# Patient Record
Sex: Male | Born: 1990 | Hispanic: Yes | Marital: Married | Smoking: Never smoker
Health system: Southern US, Community
[De-identification: ages and names within clinical notes are randomized; demographics above are authoritative.]

---

## 2019-11-25 ENCOUNTER — Emergency Department (HOSPITAL_COMMUNITY)
Admission: EM | Admit: 2019-11-25 | Discharge: 2019-11-25 | Disposition: A | Discharge status: Home / Self Care | Payer: Self-pay | Attending: Emergency Medicine | Admitting: Emergency Medicine

## 2019-11-25 ENCOUNTER — Emergency Department (HOSPITAL_COMMUNITY): Payer: Self-pay

## 2019-11-25 ENCOUNTER — Other Ambulatory Visit: Payer: Self-pay

## 2019-11-25 ENCOUNTER — Encounter (HOSPITAL_COMMUNITY): Payer: Self-pay | Admitting: Emergency Medicine

## 2019-11-25 DIAGNOSIS — Y92007 Garden or yard of unspecified non-institutional (private) residence as the place of occurrence of the external cause: Secondary | ICD-10-CM | POA: Insufficient documentation

## 2019-11-25 DIAGNOSIS — S90852A Superficial foreign body, left foot, initial encounter: Secondary | ICD-10-CM

## 2019-11-25 DIAGNOSIS — Z23 Encounter for immunization: Secondary | ICD-10-CM | POA: Insufficient documentation

## 2019-11-25 DIAGNOSIS — S91322A Laceration with foreign body, left foot, initial encounter: Secondary | ICD-10-CM | POA: Insufficient documentation

## 2019-11-25 DIAGNOSIS — Y999 Unspecified external cause status: Secondary | ICD-10-CM | POA: Insufficient documentation

## 2019-11-25 DIAGNOSIS — W294XXA Contact with nail gun, initial encounter: Secondary | ICD-10-CM | POA: Insufficient documentation

## 2019-11-25 DIAGNOSIS — Y93H9 Activity, other involving exterior property and land maintenance, building and construction: Secondary | ICD-10-CM | POA: Insufficient documentation

## 2019-11-25 MED ORDER — TETANUS-DIPHTH-ACELL PERTUSSIS 5-2.5-18.5 LF-MCG/0.5 IM SUSP
0.5000 mL | Freq: Once | INTRAMUSCULAR | Status: AC
  Administered 2019-11-25: 0.5 mL via INTRAMUSCULAR
  Filled 2019-11-25: qty 0.5

## 2019-11-25 MED ORDER — DOXYCYCLINE HYCLATE 100 MG PO CAPS: 100.0000 mg | ORAL_CAPSULE | Freq: Two times a day (BID) | ORAL | 0 refills | Status: AC

## 2019-11-25 MED ORDER — FENTANYL CITRATE (PF) 100 MCG/2ML IJ SOLN
50.0000 ug | Freq: Once | INTRAMUSCULAR | Status: AC
  Administered 2019-11-25: 50 ug via INTRAVENOUS
  Filled 2019-11-25: qty 2

## 2019-11-25 MED ORDER — CEFAZOLIN SODIUM-DEXTROSE 2-4 GM/100ML-% IV SOLN
2.0000 g | Freq: Once | INTRAVENOUS | Status: AC
  Administered 2019-11-25: 2 g via INTRAVENOUS
  Filled 2019-11-25: qty 100

## 2019-11-25 MED ORDER — LIDOCAINE HCL (PF) 1 % IJ SOLN
5.0000 mL | Freq: Once | INTRAMUSCULAR | Status: AC
  Administered 2019-11-25: 5 mL
  Filled 2019-11-25: qty 5

## 2019-11-25 NOTE — ED Provider Notes (Signed)
[facility] EMERGENCY DEPARTMENT Provider Note   CSN: [id number] Arrival date & time: 11/25/19  1241     History No chief complaint on file.  Spanish video interpreter used throughout visit Frank Banks is a 29 y.o. male otherwise healthy no daily medication use presents today after he accidentally shot himself in the foot with a nail gun.  He was building a Scientist, physiological in his backyard when the injury occurred.  He reports that the nail gun accidentally went off and the now shot through the top of his shoe straight into his foot.  He reports immediate onset sharp pain constant worse with movement of the area improved with rest, no medication prior to arrival, pain is moderate in severity.  He denies any other injuries or concerns today.  Last tetanus shot when he was 29 years old.  Denies fever/chills, numbness/tingling, weakness, arthralgias, myalgias or any additional concerns. HPI     History reviewed. No pertinent past medical history.  There are no problems to display for this patient.   History reviewed. No pertinent surgical history.     No family history on file.  Social History   Tobacco Use  . Smoking status: Not on file  Substance Use Topics  . Alcohol use: Not on file  . Drug use: Not on file    Home Medications Prior to Admission medications   Medication Sig Start Date End Date Taking? Authorizing Provider  doxycycline (VIBRAMYCIN) 100 MG capsule Take 1 capsule (100 mg total) by mouth 2 (two) times daily for 7 days. 11/25/19 12/02/19  Bill Salinas, PA-C    Allergies    Patient has no known allergies.  Review of Systems   Review of Systems Ten systems are reviewed and are negative for acute change except as noted in the HPI  Physical Exam Updated Vital Signs BP 120/65   Pulse 64   Temp 98.8 F (37.1 C) (Oral)   Resp 16   SpO2 98%   Physical Exam Constitutional:      General: He is not in acute distress.    Appearance: Normal  appearance. He is well-developed. He is not ill-appearing or diaphoretic.  HENT:     Head: Normocephalic and atraumatic.     Right Ear: External ear normal.     Left Ear: External ear normal.     Nose: Nose normal.  Eyes:     General: Vision grossly intact. Gaze aligned appropriately.     Pupils: Pupils are equal, round, and reactive to light.  Neck:     Trachea: Trachea and phonation normal. No tracheal deviation.  Cardiovascular:     Pulses:          Dorsalis pedis pulses are 2+ on the right side and 2+ on the left side.  Pulmonary:     Effort: Pulmonary effort is normal. No respiratory distress.  Abdominal:     General: There is no distension.     Palpations: Abdomen is soft.     Tenderness: There is no abdominal tenderness. There is no guarding or rebound.  Musculoskeletal:        General: Normal range of motion.     Cervical back: Normal range of motion.     Right knee: Normal.     Left knee: Normal.     Right lower leg: Normal.     Left lower leg: Normal.     Right ankle: Normal.     Right Achilles Tendon: Normal.  Left ankle: Normal.     Left Achilles Tendon: Normal.     Right foot: Normal.     Left foot: Laceration (Foreign body, nail) present.       Feet:  Feet:     Right foot:     Protective Sensation: 5 sites tested. 5 sites sensed.     Left foot:     Protective Sensation: 5 sites tested. 5 sites sensed.     Comments: Foreign body enter his chest proximal to the dorsal fourth toe. Skin:    General: Skin is warm and dry.  Neurological:     Mental Status: He is alert.     GCS: GCS eye subscore is 4. GCS verbal subscore is 5. GCS motor subscore is 6.     Comments: Speech is clear and goal oriented, follows commands Major Cranial nerves without deficit, no facial droop Moves extremities without ataxia, coordination intact  Psychiatric:        Behavior: Behavior normal.     ED Results / Procedures / Treatments   Labs (all labs ordered are listed, but  only abnormal results are displayed) Labs Reviewed - No data to display  EKG None  Radiology DG Foot 2 Views Left  Result Date: 11/25/2019 CLINICAL DATA:  Foot impaled by nail gun EXAM: LEFT FOOT - 2 VIEW COMPARISON:  None. FINDINGS: Frontal and lateral views obtained. Foot is immobilized by a boot like device. There is a nail which extends into the foot at the level of the third metatarsal. The nail appears to pass immediately medial to the midportion of the third metatarsal. The tip of the nail is slightly posterior and volar to the midportion of the third metatarsal. No fracture or dislocation is evident. Joint spaces appear normal. No erosive change. IMPRESSION: Nail noted at the level of the mid third metatarsal. Nail appears to pass immediately medial to the midportion of the third metatarsal without fracture or dislocation evident. It is difficult to exclude nail abutting the medial aspect of the mid third metatarsal. Other bony structures appear normal.  No arthropathy evident. Electronically Signed   By: Lowella Grip III M.D.   On: 11/25/2019 13:29   DG Foot Complete Left  Result Date: 11/25/2019 CLINICAL DATA:  Foreign body removal EXAM: LEFT FOOT - COMPLETE 3+ VIEW COMPARISON:  Same-day radiograph FINDINGS: Interval removal of metallic nail from the distal forefoot. No acute fracture. No dislocation. No residual metallic radiopaque foreign body. Osseous structures are intact. No significant arthropathy. IMPRESSION: No acute fracture or residual radiopaque foreign body. Electronically Signed   By: Davina Poke D.O.   On: 11/25/2019 16:30    Procedures .Foreign Body Removal  Date/Time: 11/25/2019 5:42 PM Performed by: Deliah Boston, PA-C Authorized by: Deliah Boston, PA-C  Consent: Verbal consent obtained. Risks and benefits: risks, benefits and alternatives were discussed Consent given by: patient Patient understanding: patient states understanding of the  procedure being performed Patient consent: the patient's understanding of the procedure matches consent given Relevant documents: relevant documents present and verified Test results: test results available and properly labeled Imaging studies: imaging studies available Required items: required blood products, implants, devices, and special equipment available Patient identity confirmed: verbally with patient and arm band Time out: Immediately prior to procedure a "time out" was called to verify the correct patient, procedure, equipment, support staff and site/side marked as required. Body area: skin General location: lower extremity Location details: left foot Anesthesia: local infiltration  Anesthesia: Local Anesthetic:  lidocaine 1% without epinephrine  Sedation: Patient sedated: no  Patient restrained: no Patient cooperative: yes Localization method: visualized Removal mechanism: Manually. Dressing: antibiotic ointment and dressing applied Depth: deep Complexity: simple 1 objects recovered. Objects recovered: Metal nail Post-procedure assessment: foreign body removed Patient tolerance: patient tolerated the procedure well with no immediate complications   (including critical care time)  Medications Ordered in ED Medications  fentaNYL (SUBLIMAZE) injection 50 mcg (50 mcg Intravenous Given 11/25/19 1426)  Tdap (BOOSTRIX) injection 0.5 mL (0.5 mLs Intramuscular Given 11/25/19 1454)  lidocaine (PF) (XYLOCAINE) 1 % injection 5 mL (5 mLs Infiltration Given by Other 11/25/19 1638)  fentaNYL (SUBLIMAZE) injection 50 mcg (50 mcg Intravenous Given 11/25/19 1531)  ceFAZolin (ANCEF) IVPB 2g/100 mL premix (0 g Intravenous Stopped 11/25/19 1742)    ED Course  I have reviewed the triage vital signs and the nursing notes.  Pertinent labs & imaging results that were available during my care of the patient were reviewed by me and considered in my medical decision making (see chart for  details).  Clinical Course as of Nov 24 1804  Tue Nov 25, 2019  1509 Local anesthesia, remove, xray   [BM]  1717 Discharge with orthopedic follow-up, doxycycline for prophylaxis   [BM]    Clinical Course User Index [BM] Elizabeth Palau   MDM Rules/Calculators/A&P                      DG Left Foot: IMPRESSION: Nail noted at the level of the mid third metatarsal. Nail appears to pass immediately medial to the midportion of the third metatarsal without fracture or dislocation evident. It is difficult to exclude nail abutting the medial aspect of the mid third metatarsal. Other bony structures appear normal.  No arthropathy evident. - 29 year old otherwise healthy male presents today after shooting himself in the top of his left foot with a nail gun.  No other injuries or concerns today.  Tdap was updated today.  Pain medication given and shoe was removed with trauma shears.  I personally reviewed x-ray and agree with radiologist interpretation.  I consulted with orthopedic specialist physician assistant Earney Hamburg who advised local anesthesia, remove foreign body and repeat x-ray.  This was performed successfully as above and patient tolerated the procedure well.  Post procedure irrigation was performed and dressing placed.  Patient was given 2 g Ancef per pharmacy recommendation, they advised that Cipro is not needed for Pseudomonas coverage as the nail did not go through the sole of the shoe.  Follow-up x-rays were obtained and are negative, I have personally reviewed the x-rays and agree with radiologist interpretation.  I again discussed case with orthopedist PA Earney Hamburg who agrees with discharge at this time with doxycycline and outpatient orthopedic follow-up. - Patient reassessed resting comfortably no acute distress.  Capillary refill and sensation intact to all toes, movement intact to all toes with minimal increase in pain.  He states understanding of care plan and is  agreeable for discharge with doxycycline and outpatient orthopedic follow-up.  At this time there does not appear to be any evidence of an acute emergency medical condition and the patient appears stable for discharge with appropriate outpatient follow up. Diagnosis was discussed with patient who verbalizes understanding of care plan and is agreeable to discharge. I have discussed return precautions with patient who verbalizes understanding of return precautions. Patient encouraged to follow-up with their PCP and Ortho. All questions answered.  Note: Portions of this  report may have been transcribed using voice recognition software. Every effort was made to ensure accuracy; however, inadvertent computerized transcription errors may still be present. Final Clinical Impression(s) / ED Diagnoses Final diagnoses:  Foreign body in left foot, initial encounter    Rx / DC Orders ED Discharge Orders         Ordered    doxycycline (VIBRAMYCIN) 100 MG capsule  2 times daily     11/25/19 1800           Elizabeth Palau 11/25/19 1807    Rolan Bucco, MD 12/01/19 1011

## 2019-11-25 NOTE — ED Triage Notes (Signed)
Patient presents after a nail gun went off and in his left foot. Patient still has boot on. States he feels like it is near the 4th toe and just below it.

## 2019-11-25 NOTE — Discharge Instructions (Addendum)
You have been diagnosed today with Foreign Body of Left Foot.  At this time there does not appear to be the presence of an emergent medical condition, however there is always the potential for conditions to change. Please read and follow the below instructions.  Please return to the Emergency Department immediately for any new or worsening symptoms. Please be sure to follow up with your Primary Care Provider within one week regarding your visit today; please call their office to schedule an appointment even if you are feeling better for a follow-up visit. Please take antibiotic doxycycline as prescribed to help avoid infection.  Please drink plenty water and get plenty of rest. Call the orthopedic specialist Dr. Marlou Sa on your discharge paperwork tomorrow morning to schedule a follow-up appointment for reevaluation of your foot.  Get Help Right Away If:  You develop more pain or other new symptoms around the area where the object entered the skin. You have redness, swelling, or pain around your wound or incision. You have fluid or blood coming from your wound or incision. Your wound or incision feels warm to the touch. You have pus or a bad smell coming from your wound or incision. You have a fever. You have any new/concerning or worsening of symptoms  Please read the additional information packets attached to your discharge summary.  Do not take your medicine if  develop an itchy rash, swelling in your mouth or lips, or difficulty breathing; call 911 and seek immediate emergency medical attention if this occurs.  Note: Portions of this text may have been transcribed using voice recognition software. Every effort was made to ensure accuracy; however, inadvertent computerized transcription errors may still be present. ===== Below has been translated using Google translate.  Errors may be present.  Interpret with caution.  A continuacin se ha traducido con Microbiologist. Puede haber  errores. Interprete con precaucin. ===== Hoy le han diagnosticado cuerpo extrao en el pie izquierdo.  En este momento no parece haber la presencia de una afeccin mdica emergente, sin embargo, siempre existe la posibilidad de que las afecciones [place]. Lea y Grabill instrucciones a continuacin.  1. Regrese al Departamento de Emergencias de inmediato si tiene sntomas nuevos o que [place]. 2. Asegrese de hacer un seguimiento con su Proveedor de atencin primaria dentro de una semana con respecto a su visita de hoy; por favor llame a su oficina para programar una cita incluso si se siente mejor para una visita de seguimiento. [place] antibitico doxiciclina segn lo prescrito para ayudar a evitar infecciones. Beba mucha agua y descanse lo suficiente. 4. Llame al especialista en ortopedia, el Dr. Marlou Sa, en su documentacin de alta maana por la maana para programar una cita de seguimiento para la reevaluacin de su pie.  Obtenga ayuda de inmediato si: ? Desarrolla ms dolor u otros sntomas nuevos alrededor del rea donde el objeto entr en la piel. ? Tiene enrojecimiento, hinchazn o dolor alrededor de la herida o incisin. ? Tiene lquido o sangre saliendo de su herida o incisin. ? Su herida o incisin se siente caliente al tacto. ? Tiene pus o un mal olor proveniente de su herida o incisin. ? Tienes fiebre. ? Tiene sntomas nuevos / preocupantes o que empeoran  Lea los paquetes de informacin adicional adjuntos a su resumen de alta.  No tome su medicamento si desarrolla un sarpullido con picazn, hinchazn en su boca o labios, o dificultad para respirar; Llame al 911 y busque atencin  mdica de emergencia inmediata si esto ocurre.  Nota: Es posible que algunas partes de este texto se hayan transcrito con un software de reconocimiento de voz. Se hizo todo lo posible para garantizar la precisin; sin embargo, an pueden estar presentes errores de transcripcin computarizados  inadvertidos.

## 2019-11-25 NOTE — ED Notes (Signed)
Patient transported to X-ray. Staff attempted to remove patients boot prior but nail is going through boot into foot.

## 2019-11-25 NOTE — ED Notes (Signed)
Patient verbalizes understanding of discharge instructions. Opportunity for questioning and answers were provided. Pt discharged from ED. 

## 2019-12-10 ENCOUNTER — Ambulatory Visit (INDEPENDENT_AMBULATORY_CARE_PROVIDER_SITE_OTHER): Payer: Self-pay | Admitting: Orthopedic Surgery

## 2019-12-10 ENCOUNTER — Other Ambulatory Visit: Payer: Self-pay

## 2019-12-10 ENCOUNTER — Ambulatory Visit (INDEPENDENT_AMBULATORY_CARE_PROVIDER_SITE_OTHER): Payer: Self-pay

## 2019-12-10 DIAGNOSIS — M79672 Pain in left foot: Secondary | ICD-10-CM

## 2019-12-10 DIAGNOSIS — S99922A Unspecified injury of left foot, initial encounter: Secondary | ICD-10-CM

## 2019-12-12 NOTE — Progress Notes (Signed)
Office Visit Note   Patient: Frank Banks           Date of Birth: July 24, 1991           MRN: [id number] Visit Date: 12/10/2019 Requested by: No referring provider defined for this encounter. PCP: Patient, No Pcp Per  Subjective: Chief Complaint  Patient presents with  . Left Foot - Pain    HPI: [name] is a 29 y.o. male who presents to the office complaining of left foot pain.  Patient injured his left foot about 15 days ago when he accidentally shot the left foot with a nail gun.  He was seen in the emergency department where the nail was removed.  X-rays taken at that time showed no evidence of fracture.  Today patient is having no pain.  He has finished his course of antibiotics.  He is not walking with a limp and he is currently returned to work.  He is not taking any medication for pain.  Overall he is doing well with no complaints.  He was out of work for 1 week but then he returned to work in a full capacity.Marland Kitchen He denies any fevers or chills.              ROS:  All systems reviewed are negative as they relate to the chief complaint within the history of present illness.  Patient denies fevers or chills.  Assessment & Plan: Visit Diagnoses:  1. Injury of left foot, initial encounter   2. Pain in left foot     Plan: Patient is a 29 year old male presents following left foot injury.  He actually shot his left foot with a nail gun and it was taken out in the emergency department.  His tetanus was updated and he was provided with a 7-day course of antibiotics which she has completed.  He is doing well today and radiographs of the left foot taken today reveal no evidence of any bony injury.  He is able to walk without pain.  The injury site seems to be healing well with no evidence of infection, erythema.  Plan for patient to follow-up with the office as needed.  Follow-Up Instructions: No follow-ups on file.   Orders:  Orders Placed This Encounter  Procedures  . XR Foot  Complete Left   No orders of the defined types were placed in this encounter.     Procedures: No procedures performed   Clinical Data: No additional findings.  Objective: Vital Signs: There were no vitals taken for this visit.  Physical Exam:  Constitutional: Patient appears well-developed HEENT:  Head: Normocephalic Eyes:EOM are normal Neck: Normal range of motion Cardiovascular: Normal rate Pulmonary/chest: Effort normal Neurologic: Patient is alert Skin: Skin is warm Psychiatric: Patient has normal mood and affect  Ortho Exam:  Left foot exam No significant tenderness to palpation throughout the left foot.  Injury site was inspected and is healing well.  No open wound.  No surrounding erythema.  No drainage is expressible.  Specialty Comments:  No specialty comments available.  Imaging: No results found.   PMFS History: There are no problems to display for this patient.  No past medical history on file.  No family history on file.  No past surgical history on file. Social History   Occupational History  . Not on file  Tobacco Use  . Smoking status: Not on file  Substance and Sexual Activity  . Alcohol use: Not on file  . Drug use:  Not on file  . Sexual activity: Not on file

## 2019-12-15 ENCOUNTER — Encounter: Payer: Self-pay | Admitting: Orthopedic Surgery

## 2021-04-26 IMAGING — DX DG FOOT 2V*L*
2 series · 2 of 2 positions shown · non-contrast
Comparison: None.

CLINICAL DATA: Foot impaled by nail gun

EXAM:
LEFT FOOT - 2 VIEW

[foot ap]
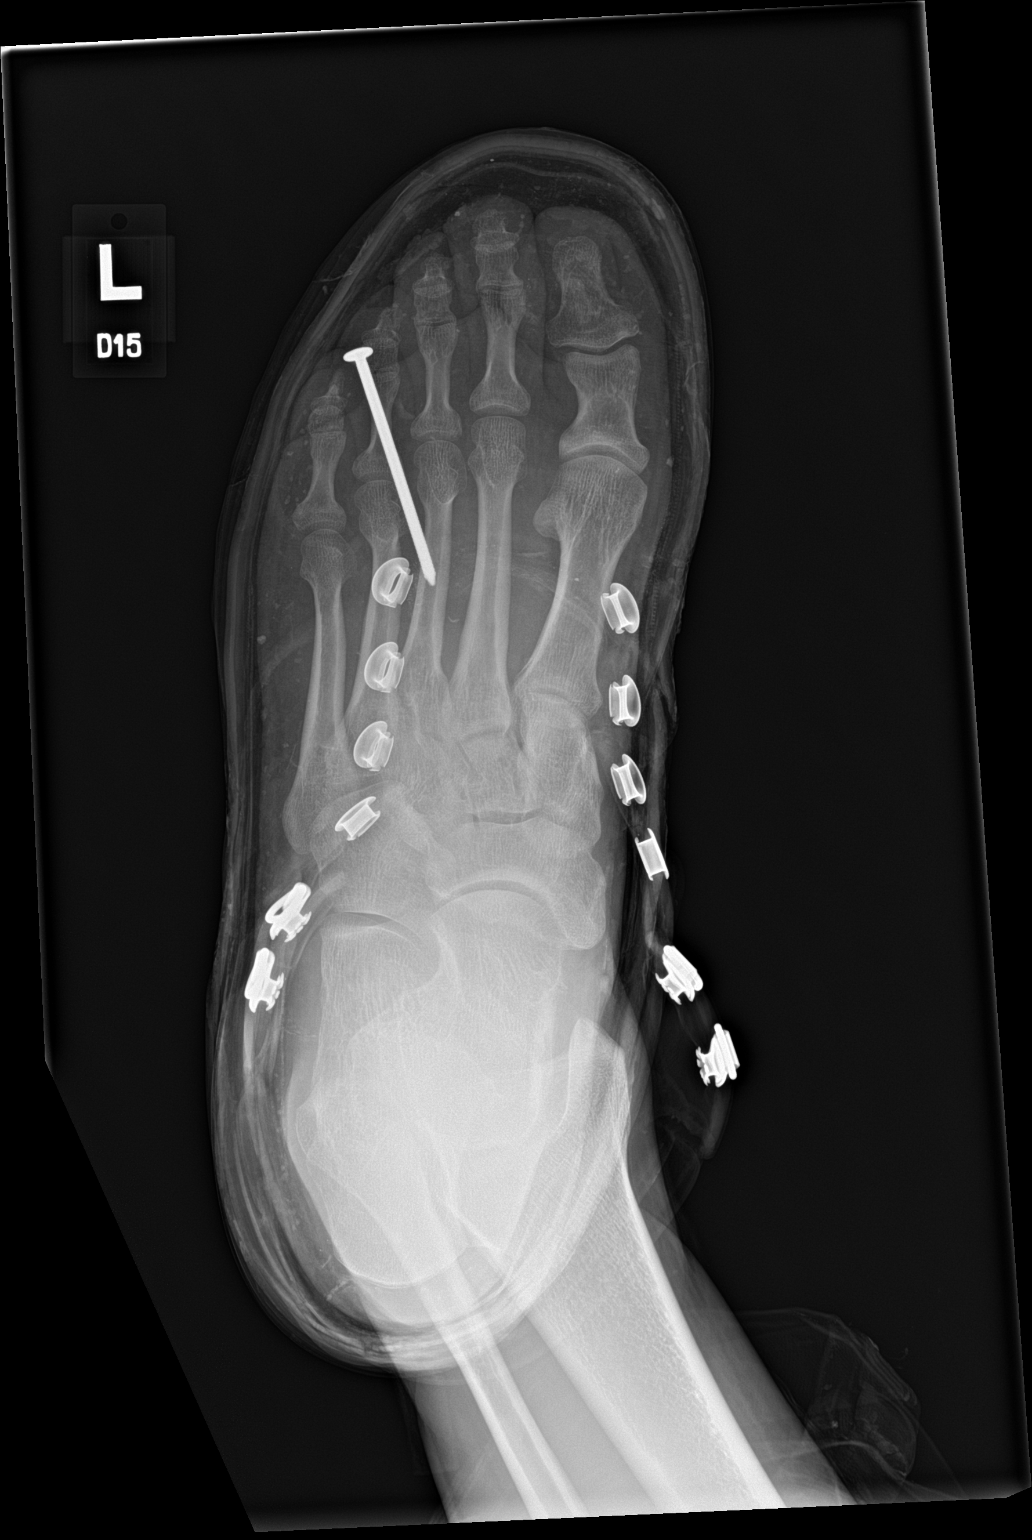

[foot lat]
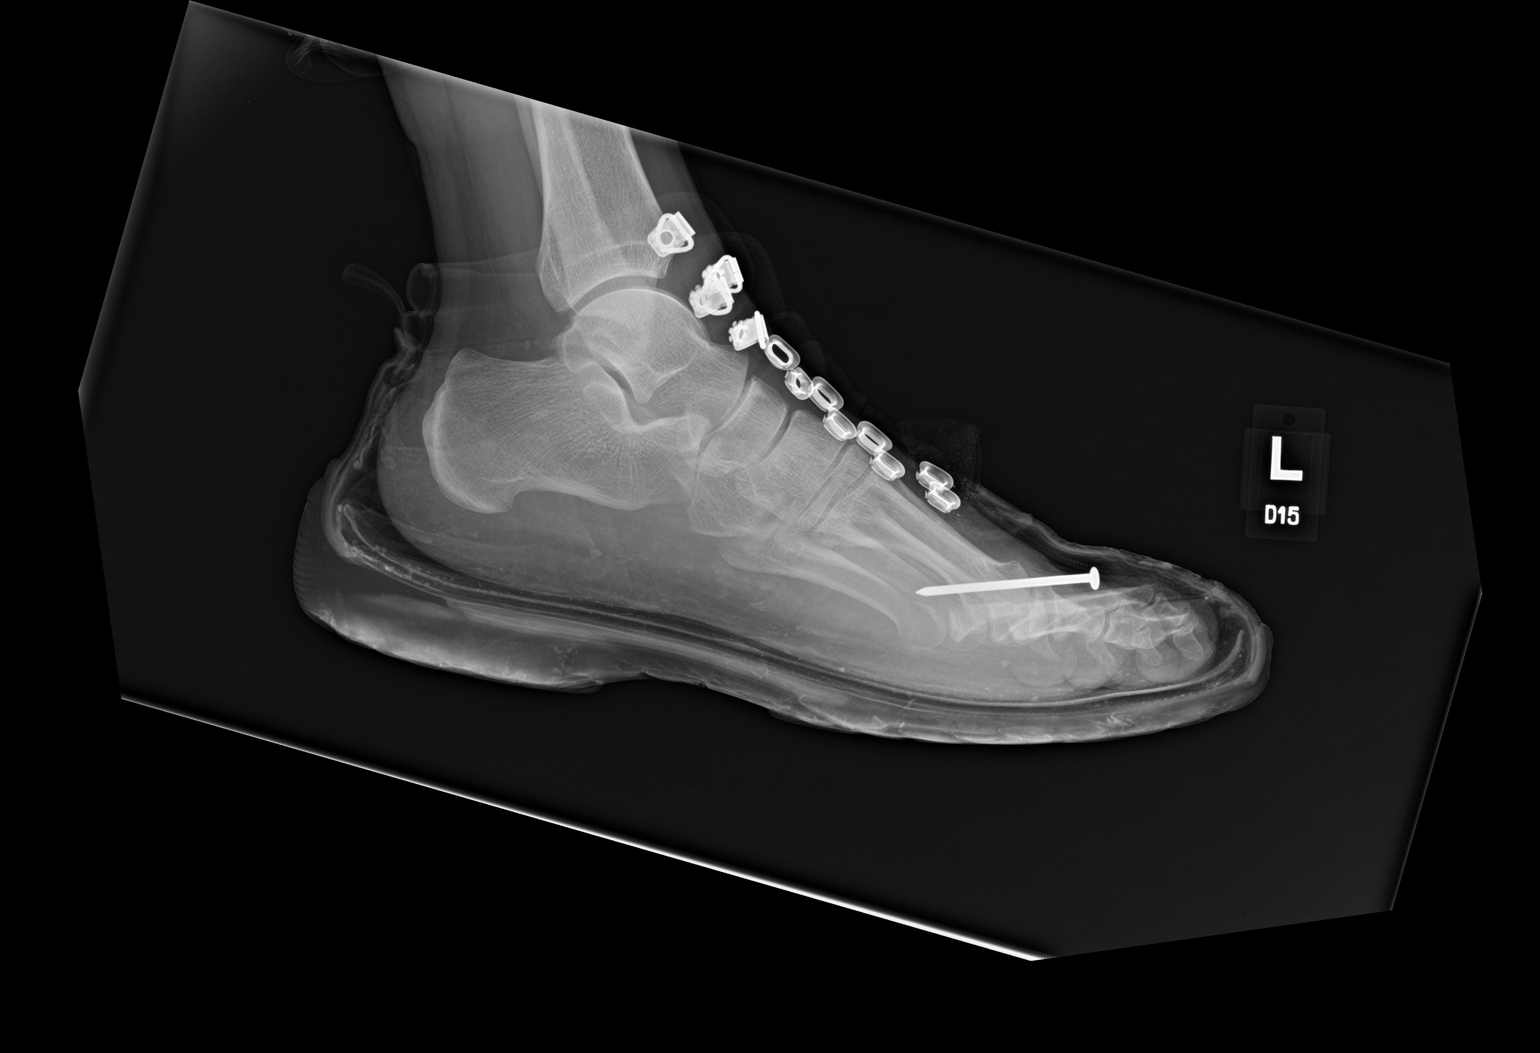

[2 of 2 positions shown; findings below may reference images not displayed]

FINDINGS: Frontal and lateral views obtained. Foot is immobilized by a boot
like device. There is a nail which extends into the foot at the
level of the third metatarsal. The nail appears to pass immediately
medial to the midportion of the third metatarsal. The tip of the
nail is slightly posterior and volar to the midportion of the third
metatarsal. No fracture or dislocation is evident. Joint spaces
appear normal. No erosive change.
IMPRESSION: Nail noted at the level of the mid third metatarsal. Nail appears to
pass immediately medial to the midportion of the third metatarsal
without fracture or dislocation evident. It is difficult to exclude
nail abutting the medial aspect of the mid third metatarsal.

Other bony structures appear normal.  No arthropathy evident.
